# Patient Record
Sex: Male | Born: 2005 | Race: White | Hispanic: No | Marital: Single | Smoking: Never smoker
Health system: Southern US, Community
[De-identification: ages and names within clinical notes are randomized; demographics above are authoritative.]

---
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
## 2024-04-11 ENCOUNTER — Emergency Department (HOSPITAL_COMMUNITY): Admission: EM | Admit: 2024-04-11 | Discharge: 2024-04-11 | Disposition: A | Discharge status: Home / Self Care

## 2024-04-11 ENCOUNTER — Encounter (HOSPITAL_COMMUNITY): Payer: Self-pay | Admitting: Emergency Medicine

## 2024-04-11 ENCOUNTER — Other Ambulatory Visit: Payer: Self-pay

## 2024-04-11 DIAGNOSIS — T63461A Toxic effect of venom of wasps, accidental (unintentional), initial encounter: Secondary | ICD-10-CM | POA: Insufficient documentation

## 2024-04-11 DIAGNOSIS — T782XXA Anaphylactic shock, unspecified, initial encounter: Secondary | ICD-10-CM

## 2024-04-11 MED ORDER — EPINEPHRINE 0.3 MG/0.3ML IJ SOAJ: 0.3000 mg | INTRAMUSCULAR | 0 refills | Status: AC | PRN

## 2024-04-11 MED ORDER — METHYLPREDNISOLONE 4 MG PO TBPK: ORAL_TABLET | ORAL | 0 refills | Status: AC

## 2024-04-11 NOTE — ED Triage Notes (Signed)
 Pt BIB [facility] from urgent care due to allergic reaction due to being stung by multiple yellow jackets.  Pt did have redness, hives and oral swelling at that time.  Urgent care gave epi junior 0.15 IM.  300mg  Bendadryl drank orally at home.  Pt hypotensive, dizzy, nauseous and vomiting.  18g right AC.  300ml NS. VS BP 108/56, HR60, SpO2 100%

## 2024-04-11 NOTE — ED Provider Notes (Signed)
 [place] [facility] AT [facility] Provider Note   CSN: [id number] Arrival date & time: 04/11/24  1256     History  Chief Complaint  Patient presents with   Allergic Reaction    Manley Fason is a 18 y.o. male.  Patient with no pertinent past medical history presents today with complaints of anaphylaxis. He states that this morning he was stung by yellow jackets several times on his legs. Soon after this, developed an itchy rash followed by swelling to his lips with nausea and vomiting. He drank half a bottle of benadryl with no improvement and subsequently went to urgent care where he was given steroids and epinephrine and told to come here for additional evaluation. He denies any history of anaphylaxis previously. Currently patient feels back to normal.   The history is provided by the patient. No language interpreter was used.  Allergic Reaction      Home Medications Prior to Admission medications   Not on File      Allergies    Penicillins    Review of Systems   Review of Systems  All other systems reviewed and are negative.   Physical Exam Updated Vital Signs BP 116/70 (BP Location: Left Arm)   Pulse 64   Temp 98.3 F (36.8 C) (Oral)   Resp 20   Ht 6\' 3"  (1.905 m)   Wt 76.7 kg   SpO2 100%   BMI 21.12 kg/m  Physical Exam Vitals and nursing note reviewed.  Constitutional:      General: He is not in acute distress.    Appearance: Normal appearance. He is normal weight. He is not ill-appearing, toxic-appearing or diaphoretic.  HENT:     Head: Normocephalic and atraumatic.  Cardiovascular:     Rate and Rhythm: Normal rate and regular rhythm.     Heart sounds: Normal heart sounds.  Pulmonary:     Effort: Pulmonary effort is normal. No respiratory distress.     Breath sounds: Normal breath sounds.  Abdominal:     General: Abdomen is flat.     Palpations: Abdomen is soft.     Tenderness: There is no abdominal tenderness.   Musculoskeletal:        General: Normal range of motion.     Cervical back: Normal range of motion.  Skin:    General: Skin is warm and dry.     Findings: No rash.     Comments: Several wounds noted to the bilateral lower legs consistent with yellow jacket stings. No retained stinger visualized.   Neurological:     General: No focal deficit present.     Mental Status: He is alert.  Psychiatric:        Mood and Affect: Mood normal.        Behavior: Behavior normal.     ED Results / Procedures / Treatments   Labs (all labs ordered are listed, but only abnormal results are displayed) Labs Reviewed - No data to display  EKG None  Radiology No results found.  Procedures Procedures    Medications Ordered in ED Medications - No data to display  ED Course/ Medical Decision Making/ A&P                                 Medical Decision Making  This patient is a 18 y.o. male who presents to the ED for concern of anaphylaxis.   Differential  diagnoses prior to evaluation: Anaphylaxis, allergic reaction  Past Medical History / Social History / Additional history: Chart reviewed. Pertinent results include: no pertinent medical history  Physical Exam: Physical exam performed. The pertinent findings include: well appearing, asymptomatic  Chart review: Chart reviewed. Pertinent results include: Patient seen at urgent care, given solu-medrol, 0.15 mg IM epinephrine, sent here   Disposition: After consideration of the diagnostic results and the patients response to treatment, I feel that emergency department workup does not suggest an emergent condition requiring admission or immediate intervention beyond what has been performed at this time. The plan is: Discharge with steroids and an EpiPen for management of anaphylaxis.  Patient received his epinephrine at 1130 this morning, at 330 this afternoon he continues to be asymptomatic and would like to go home.  Discussed steroids and  EpiPen with the patient, emphasized the importance of carrying his EpiPen regularly especially when he is going to spend an extended time outdoors given concern for new anaphylaxis to yellow jackets. Evaluation and diagnostic testing in the emergency department does not suggest an emergent condition requiring admission or immediate intervention beyond what has been performed at this time.  Plan for discharge with close PCP follow-up.  Patient is understanding and amenable with plan, educated on red flag symptoms that would prompt immediate return.  Patient discharged in stable condition.   This is a shared visit with supervising physician Dr. Linder Revere who has independently evaluated patient & provided guidance in evaluation/management/disposition, in agreement with care   Final Clinical Impression(s) / ED Diagnoses Final diagnoses:  Anaphylaxis, initial encounter    Rx / DC Orders ED Discharge Orders          Ordered    methylPREDNISolone (MEDROL DOSEPAK) 4 MG TBPK tablet        04/11/24 1515    EPINEPHrine 0.3 mg/0.3 mL IJ SOAJ injection  As needed        04/11/24 1515          An After Visit Summary was printed and given to the patient.     Sherra Dk, PA-C 04/11/24 1518    Rolinda Climes, DO 04/11/24 1620

## 2024-04-11 NOTE — Discharge Instructions (Signed)
 As we discussed, it does sound like you had anaphylaxis in response to the yellowjacket stings that you had earlier today.  We have monitored you for several hours and given that you had no recurrence of symptoms, no further evaluation is indicated.  I have given you a prescription for steroids to take for any residual symptoms.  Take this as prescribed.  I have also written you a prescription for an EpiPen.  It is very important that you carry this with you particularly when you are spending time outdoors in case you are stung again. Follow-up with your pcp as needed.   Return if development of any new or worsening symptoms.
